# Patient Record
Sex: Female | Born: 1935 | Race: White | Hispanic: No | Marital: Single | Smoking: Never smoker
Health system: Southern US, Community
[De-identification: ages and names within clinical notes are randomized; demographics above are authoritative.]

## PROBLEM LIST (undated history)

## (undated) DIAGNOSIS — I10 Essential (primary) hypertension: Secondary | ICD-10-CM

## (undated) DIAGNOSIS — J449 Chronic obstructive pulmonary disease, unspecified: Secondary | ICD-10-CM

## (undated) DIAGNOSIS — M810 Age-related osteoporosis without current pathological fracture: Secondary | ICD-10-CM

## (undated) HISTORY — PX: ABDOMINAL HYSTERECTOMY: SHX81

---

## 2013-05-22 ENCOUNTER — Encounter (HOSPITAL_COMMUNITY): Payer: Self-pay

## 2013-05-22 ENCOUNTER — Inpatient Hospital Stay (HOSPITAL_COMMUNITY)
Admission: EM | Admit: 2013-05-22 | Discharge: 2013-05-23 | DRG: 392 | Disposition: A | Discharge status: Home / Self Care | Payer: Medicare Other | Attending: Internal Medicine | Admitting: Internal Medicine

## 2013-05-22 ENCOUNTER — Emergency Department (HOSPITAL_COMMUNITY): Payer: Medicare Other

## 2013-05-22 DIAGNOSIS — J441 Chronic obstructive pulmonary disease with (acute) exacerbation: Secondary | ICD-10-CM

## 2013-05-22 DIAGNOSIS — I10 Essential (primary) hypertension: Secondary | ICD-10-CM | POA: Diagnosis present

## 2013-05-22 DIAGNOSIS — D72829 Elevated white blood cell count, unspecified: Secondary | ICD-10-CM | POA: Diagnosis present

## 2013-05-22 DIAGNOSIS — M810 Age-related osteoporosis without current pathological fracture: Secondary | ICD-10-CM | POA: Diagnosis present

## 2013-05-22 DIAGNOSIS — K5289 Other specified noninfective gastroenteritis and colitis: Principal | ICD-10-CM | POA: Diagnosis present

## 2013-05-22 DIAGNOSIS — R109 Unspecified abdominal pain: Secondary | ICD-10-CM

## 2013-05-22 DIAGNOSIS — K5900 Constipation, unspecified: Secondary | ICD-10-CM

## 2013-05-22 HISTORY — DX: Age-related osteoporosis without current pathological fracture: M81.0

## 2013-05-22 HISTORY — DX: Chronic obstructive pulmonary disease, unspecified: J44.9

## 2013-05-22 HISTORY — DX: Essential (primary) hypertension: I10

## 2013-05-22 LAB — CBC WITH DIFFERENTIAL/PLATELET
Basophils Absolute: 0 10*3/uL (ref 0.0–0.1)
Lymphocytes Relative: 6 % — ABNORMAL LOW (ref 12–46)
Lymphs Abs: 1.2 10*3/uL (ref 0.7–4.0)
Neutrophils Relative %: 89 % — ABNORMAL HIGH (ref 43–77)
Platelets: 292 10*3/uL (ref 150–400)
RBC: 4.47 MIL/uL (ref 3.87–5.11)
WBC: 19.9 10*3/uL — ABNORMAL HIGH (ref 4.0–10.5)

## 2013-05-22 LAB — COMPREHENSIVE METABOLIC PANEL
ALT: 11 U/L (ref 0–35)
AST: 22 U/L (ref 0–37)
Alkaline Phosphatase: 71 U/L (ref 39–117)
CO2: 23 mEq/L (ref 19–32)
GFR calc Af Amer: >90 mL/min (ref >90)
GFR calc non Af Amer: 89 mL/min — ABNORMAL LOW (ref >90)
Glucose, Bld: 134 mg/dL — ABNORMAL HIGH (ref 70–99)
Potassium: 3.6 mEq/L (ref 3.5–5.1)
Sodium: 136 mEq/L (ref 135–145)
Total Protein: 7.9 g/dL (ref 6.0–8.3)

## 2013-05-22 LAB — URINALYSIS, ROUTINE W REFLEX MICROSCOPIC
Bilirubin Urine: NEGATIVE
Glucose, UA: NEGATIVE mg/dL
Specific Gravity, Urine: 1.018 (ref 1.005–1.030)
Urobilinogen, UA: 0.2 mg/dL (ref 0.0–1.0)

## 2013-05-22 LAB — URINE MICROSCOPIC-ADD ON

## 2013-05-22 LAB — LIPASE, BLOOD: Lipase: 29 U/L (ref 11–59)

## 2013-05-22 LAB — TROPONIN I: Troponin I: <0.3 ng/mL (ref <0.30)

## 2013-05-22 MED ORDER — CIPROFLOXACIN IN D5W 400 MG/200ML IV SOLN
400.0000 mg | Freq: Two times a day (BID) | INTRAVENOUS | Status: DC
  Administered 2013-05-22 – 2013-05-23 (×2): 400 mg via INTRAVENOUS
  Filled 2013-05-22 (×4): qty 200

## 2013-05-22 MED ORDER — ASPIRIN EC 81 MG PO TBEC
81.0000 mg | DELAYED_RELEASE_TABLET | ORAL | Status: DC
  Administered 2013-05-23: 81 mg via ORAL
  Filled 2013-05-22 (×2): qty 1

## 2013-05-22 MED ORDER — ONDANSETRON HCL 4 MG/2ML IJ SOLN: 4.0000 mg | Freq: Four times a day (QID) | INTRAMUSCULAR | Status: DC | PRN

## 2013-05-22 MED ORDER — METRONIDAZOLE IN NACL 5-0.79 MG/ML-% IV SOLN
500.0000 mg | Freq: Three times a day (TID) | INTRAVENOUS | Status: DC
  Administered 2013-05-22 – 2013-05-23 (×3): 500 mg via INTRAVENOUS
  Filled 2013-05-22 (×5): qty 100

## 2013-05-22 MED ORDER — HYDROMORPHONE HCL PF 1 MG/ML IJ SOLN
0.5000 mg | Freq: Once | INTRAMUSCULAR | Status: AC
  Administered 2013-05-22: 0.5 mg via INTRAVENOUS
  Filled 2013-05-22: qty 1

## 2013-05-22 MED ORDER — PANTOPRAZOLE SODIUM 40 MG IV SOLR
40.0000 mg | INTRAVENOUS | Status: DC
  Administered 2013-05-22: 40 mg via INTRAVENOUS
  Filled 2013-05-22 (×3): qty 40

## 2013-05-22 MED ORDER — ACETAMINOPHEN 325 MG PO TABS: 650.0000 mg | ORAL_TABLET | Freq: Four times a day (QID) | ORAL | Status: DC | PRN

## 2013-05-22 MED ORDER — ENOXAPARIN SODIUM 40 MG/0.4ML ~~LOC~~ SOLN
40.0000 mg | SUBCUTANEOUS | Status: DC
  Filled 2013-05-22: qty 0.4

## 2013-05-22 MED ORDER — ONDANSETRON HCL 4 MG/2ML IJ SOLN
4.0000 mg | Freq: Once | INTRAMUSCULAR | Status: AC
Start: 2013-05-22 — End: 2013-05-22
  Administered 2013-05-22: 4 mg via INTRAVENOUS
  Filled 2013-05-22: qty 2

## 2013-05-22 MED ORDER — SODIUM CHLORIDE 0.9 % IV SOLN
INTRAVENOUS | Status: DC
  Administered 2013-05-22: 18:00:00 via INTRAVENOUS

## 2013-05-22 MED ORDER — POLYETHYLENE GLYCOL 3350 17 G PO PACK
17.0000 g | PACK | Freq: Every day | ORAL | Status: DC
  Filled 2013-05-22 (×2): qty 1

## 2013-05-22 MED ORDER — ONDANSETRON HCL 4 MG PO TABS: 4.0000 mg | ORAL_TABLET | Freq: Four times a day (QID) | ORAL | Status: DC | PRN

## 2013-05-22 MED ORDER — SODIUM CHLORIDE 0.9 % IJ SOLN: 3.0000 mL | Freq: Two times a day (BID) | INTRAMUSCULAR | Status: DC

## 2013-05-22 MED ORDER — SODIUM CHLORIDE 0.9 % IV BOLUS (SEPSIS)
500.0000 mL | Freq: Once | INTRAVENOUS | Status: AC
  Administered 2013-05-22: 500 mL via INTRAVENOUS

## 2013-05-22 MED ORDER — MOMETASONE FURO-FORMOTEROL FUM 100-5 MCG/ACT IN AERO
2.0000 | INHALATION_SPRAY | Freq: Two times a day (BID) | RESPIRATORY_TRACT | Status: DC
  Administered 2013-05-22 – 2013-05-23 (×2): 2 via RESPIRATORY_TRACT
  Filled 2013-05-22: qty 8.8

## 2013-05-22 MED ORDER — ACETAMINOPHEN 650 MG RE SUPP: 650.0000 mg | Freq: Four times a day (QID) | RECTAL | Status: DC | PRN

## 2013-05-22 NOTE — ED Notes (Signed)
MD at bedside. 

## 2013-05-22 NOTE — ED Provider Notes (Signed)
History     CSN: [id number]  Arrival date & time 05/22/13  1237   First MD Initiated Contact with Patient 05/22/13 1315      Chief Complaint  Patient presents with  . Abdominal Pain    (Consider location/radiation/quality/duration/timing/severity/associated sxs/prior treatment) Patient is a 79 y.o. female presenting with abdominal pain. The history is provided by the patient (THE PT stastes she has abd pain and is constipated).  Abdominal Pain This is a new problem. The current episode started 12 to 24 hours ago. The problem occurs constantly. The problem has not changed since onset.Associated symptoms include abdominal pain. Pertinent negatives include no chest pain and no headaches. Nothing aggravates the symptoms. Nothing relieves the symptoms.    Past Medical History  Diagnosis Date  . Hypertension   . COPD (chronic obstructive pulmonary disease)   . Osteoporosis     Past Surgical History  Procedure Laterality Date  . Abdominal hysterectomy      No family history on file.  History  Substance Use Topics  . Smoking status: Never Smoker   . Smokeless tobacco: Not on file  . Alcohol Use: No    OB History   Grav Para Term Preterm Abortions TAB SAB Ect Mult Living                  Review of Systems  Constitutional: Negative for appetite change and fatigue.  HENT: Negative for congestion, sinus pressure and ear discharge.   Eyes: Negative for discharge.  Respiratory: Negative for cough.   Cardiovascular: Negative for chest pain.  Gastrointestinal: Positive for abdominal pain. Negative for diarrhea.  Genitourinary: Negative for frequency and hematuria.  Musculoskeletal: Negative for back pain.  Skin: Negative for rash.  Neurological: Negative for seizures and headaches.  Psychiatric/Behavioral: Negative for hallucinations.    Allergies  Penicillins and Sulfa antibiotics  Home Medications   Current Outpatient Rx  Name  Route  Sig  Dispense  Refill  .  amLODipine (NORVASC) 5 MG tablet   Oral   Take 5 mg by mouth daily.         Marland Kitchen aspirin EC 81 MG tablet   Oral   Take 81 mg by mouth every other day.         Marland Kitchen CALCIUM CARBONATE PO   Oral   Take 750 mg by mouth every other day.         . Coenzyme Q10 (CO Q 10) 10 MG CAPS   Oral   Take 1 capsule by mouth daily.         . Fluticasone-Salmeterol (ADVAIR) 250-50 MCG/DOSE AEPB   Inhalation   Inhale 1 puff into the lungs every 12 (twelve) hours.         Marland Kitchen ibuprofen (ADVIL,MOTRIN) 200 MG tablet   Oral   Take 400 mg by mouth every 6 (six) hours as needed for pain.         . methenamine (UREX) 1 G tablet   Oral   Take 1 g by mouth daily.         . Multiple Vitamin (MULTIVITAMIN WITH MINERALS) TABS   Oral   Take 1 tablet by mouth daily.         Marland Kitchen omeprazole (PRILOSEC) 20 MG capsule   Oral   Take 20 mg by mouth daily.         Marland Kitchen OVER THE COUNTER MEDICATION   Oral   Take 1 capsule by mouth 2 (two) times daily. Eye  promise restore         . simvastatin (ZOCOR) 10 MG tablet   Oral   Take 10 mg by mouth at bedtime.           BP 148/57  Pulse 61  Temp(Src) 97.7 F (36.5 C) (Oral)  Resp 16  Ht 5' (1.524 m)  Wt 120 lb (54.432 kg)  BMI 23.44 kg/m2  SpO2 100%  Physical Exam  Constitutional: She is oriented to person, place, and time. She appears well-developed.  HENT:  Head: Normocephalic.  Eyes: Conjunctivae and EOM are normal. No scleral icterus.  Neck: Neck supple. No thyromegaly present.  Cardiovascular: Normal rate and regular rhythm.  Exam reveals no gallop and no friction rub.   No murmur heard. Pulmonary/Chest: No stridor. She has no wheezes. She has no rales. She exhibits no tenderness.  Abdominal: She exhibits no distension. There is tenderness. There is no rebound.  Tender llq and rlq   Genitourinary:  Rectal hem neg  Musculoskeletal: Normal range of motion. She exhibits no edema.  Lymphadenopathy:    She has no cervical adenopathy.   Neurological: She is oriented to person, place, and time. Coordination normal.  Skin: No rash noted. No erythema.  Psychiatric: She has a normal mood and affect. Her behavior is normal.    ED Course  Procedures (including critical care time)  Labs Reviewed  CBC WITH DIFFERENTIAL - Abnormal; Notable for the following:    WBC 19.9 (*)    Neutrophils Relative % 89 (*)    Neutro Abs 17.7 (*)    Lymphocytes Relative 6 (*)    All other components within normal limits  COMPREHENSIVE METABOLIC PANEL - Abnormal; Notable for the following:    Glucose, Bld 134 (*)    GFR calc non Af Amer 89 (*)    All other components within normal limits  OCCULT BLOOD, POC DEVICE - Abnormal; Notable for the following:    Fecal Occult Bld POSITIVE (*)    All other components within normal limits  URINALYSIS, ROUTINE W REFLEX MICROSCOPIC   Ct Abdomen Pelvis Wo Contrast  05/22/2013   *RADIOLOGY REPORT*  Clinical Data: Abdominal pain.  CT ABDOMEN AND PELVIS WITHOUT CONTRAST  Technique:  Multidetector CT imaging of the abdomen and pelvis was performed following the standard protocol without intravenous contrast.  Comparison: None  Findings: The lung bases demonstrate patchy areas of scarring and atelectasis.  No pleural effusion.  There is a small hiatal hernia.  The unenhanced appearance of the liver is unremarkable.  A simple cyst is noted in the right lobe inferiorly.  The gallbladder is grossly normal.  No common bile duct dilatation.  The pancreas is normal.  The spleen is normal.  The adrenal glands and kidneys are normal.  No renal or obstructing ureteral calculi.  The stomach, duodenum, small bowel and colon are grossly normal without oral contrast.  There is a moderate amount of stool throughout the colon and down into the rectum which may suggest constipation.  The aorta demonstrates advanced atherosclerotic calcifications.  No focal aneurysm.  No mesenteric or retroperitoneal mass or adenopathy.  The uterus is  surgically absent.  The bladder appears normal.  No pelvic mass or adenopathy.  No significant free pelvic fluid collections.  No inguinal mass or hernia.  Examination of the bony structures demonstrates probable changes of Paget's disease involving the left hemi pelvis, left hip and the L5 vertebral body.  IMPRESSION:  1.  No acute abdominal/pelvic findings, mass  lesions or adenopathy. 2.  Moderate atherosclerotic calcifications involving the aorta. 3.  Osseous changes of Paget's disease involving the left hemi pelvis and left hip. 4.  Probable constipation. 5.  Small hiatal hernia.   Original Report Authenticated By: Rudie Meyer, M.D.     1. Abdominal pain       MDM          Benny Lennert, MD 05/22/13 1520

## 2013-05-22 NOTE — ED Notes (Signed)
She c/o sudden onset of severe low abd. Pain this morning.  She had had constipation, for which she had taken a suppository, then a Fleet's enema earlier this morning, and subsequently had a tiny, hard b.m.  She feels now as if she is "about to pass out".  Skin pale, warm and dry and she is breathing normally.

## 2013-05-22 NOTE — H&P (Signed)
[facility] History and Physical  Brittney Perez VHQ:[id number] DOB: 07/24/1931 DOA: 05/22/2013  Referring physician: ER PCP: No primary provider on file.   Chief Complaint: Abdominal pain   HPI:  79 year old female presents to the ER with a chief complaint of abdominal pain which is described in the left and right lower quadrant. The patient has had a bowel movement this morning however he still feels full. She denies any nausea or vomiting. She denies any fever chills riders cough shortness of breath. She denies any hematochezia or melena. She denies any weight loss.      Review of Systems: negative for the following  Constitutional: Denies fever, chills, diaphoresis, appetite change and fatigue.  HEENT: Denies photophobia, eye pain, redness, hearing loss, ear pain, congestion, sore throat, rhinorrhea, sneezing, mouth sores, trouble swallowing, neck pain, neck stiffness and tinnitus.  Respiratory: Denies SOB, DOE, cough, chest tightness, and wheezing.  Cardiovascular: Denies chest pain, palpitations and leg swelling.  Gastrointestinal: Denies nausea, vomiting, abdominal pain, diarrhea, constipation, blood in stool and abdominal distention.  Genitourinary: Denies dysuria, urgency, frequency, hematuria, flank pain and difficulty urinating.  Musculoskeletal: Denies myalgias, back pain, joint swelling, arthralgias and gait problem.  Skin: Denies pallor, rash and wound.  Neurological: Denies dizziness, seizures, syncope, weakness, light-headedness, numbness and headaches.  Hematological: Denies adenopathy. Easy bruising, personal or family bleeding history  Psychiatric/Behavioral: Denies suicidal ideation, mood changes, confusion, nervousness, sleep disturbance and agitation       Past Medical History  Diagnosis Date  . Hypertension   . COPD (chronic obstructive pulmonary disease)   . Osteoporosis      Past Surgical History  Procedure Laterality Date  . Abdominal  hysterectomy        Social History:  reports that she has never smoked. She does not have any smokeless tobacco history on file. She reports that she does not drink alcohol or use illicit drugs.     Allergies  Allergen Reactions  . Penicillins Rash  . Sulfa Antibiotics Hives and Rash    No family history on file.   Prior to Admission medications   Medication Sig Start Date End Date Taking? Authorizing Provider  amLODipine (NORVASC) 5 MG tablet Take 5 mg by mouth daily.   Yes Historical Provider, MD  aspirin EC 81 MG tablet Take 81 mg by mouth every other day.   Yes Historical Provider, MD  CALCIUM CARBONATE PO Take 750 mg by mouth every other day.   Yes Historical Provider, MD  Coenzyme Q10 (CO Q 10) 10 MG CAPS Take 1 capsule by mouth daily.   Yes Historical Provider, MD  Fluticasone-Salmeterol (ADVAIR) 250-50 MCG/DOSE AEPB Inhale 1 puff into the lungs every 12 (twelve) hours.   Yes Historical Provider, MD  ibuprofen (ADVIL,MOTRIN) 200 MG tablet Take 400 mg by mouth every 6 (six) hours as needed for pain.   Yes Historical Provider, MD  methenamine (UREX) 1 G tablet Take 1 g by mouth daily.   Yes Historical Provider, MD  Multiple Vitamin (MULTIVITAMIN WITH MINERALS) TABS Take 1 tablet by mouth daily.   Yes Historical Provider, MD  omeprazole (PRILOSEC) 20 MG capsule Take 20 mg by mouth daily.   Yes Historical Provider, MD  OVER THE COUNTER MEDICATION Take 1 capsule by mouth 2 (two) times daily. Eye promise restore   Yes Historical Provider, MD  simvastatin (ZOCOR) 10 MG tablet Take 10 mg by mouth at bedtime.   Yes Historical Provider, MD     Physical Exam: Filed Vitals:  05/22/13 1247 05/22/13 1500 05/22/13 1623  BP: 148/57  134/74  Pulse: 61  71  Temp: 97.7 F (36.5 C)  98.1 F (36.7 C)  TempSrc: Oral  Oral  Resp: 16 16 18   Height: 5' (1.524 m)    Weight: 54.432 kg (120 lb)    SpO2: 100%  94%     Constitutional: Vital signs reviewed. Patient is a well-developed and  well-nourished in no acute distress and cooperative with exam. Alert and oriented x3.  Head: Normocephalic and atraumatic  Ear: TM normal bilaterally  Mouth: no erythema or exudates, MMM  Eyes: PERRL, EOMI, conjunctivae normal, No scleral icterus.  Neck: Supple, Trachea midline normal ROM, No JVD, mass, thyromegaly, or carotid bruit present.  Cardiovascular: RRR, S1 normal, S2 normal, no MRG, pulses symmetric and intact bilaterally  Pulmonary/Chest: CTAB, no wheezes, rales, or rhonchi  Abdominal: Soft. Non-tender, non-distended, bowel sounds are normal, no masses, organomegaly, or guarding present.  GU: no CVA tenderness Musculoskeletal: No joint deformities, erythema, or stiffness, ROM full and no nontender Ext: no edema and no cyanosis, pulses palpable bilaterally (DP and PT)  Hematology: no cervical, inginal, or axillary adenopathy.  Neurological: A&O x3, Strenght is normal and symmetric bilaterally, cranial nerve II-XII are grossly intact, no focal motor deficit, sensory intact to light touch bilaterally.  Skin: Warm, dry and intact. No rash, cyanosis, or clubbing.  Psychiatric: Normal mood and affect. speech and behavior is normal. Judgment and thought content normal. Cognition and memory are normal.       Labs on Admission:    Basic Metabolic Panel:  Recent Labs Lab 05/22/13 1340  NA 136  K 3.6  CL 100  CO2 23  GLUCOSE 134*  BUN 22  CREATININE 0.50  CALCIUM 10.0   Liver Function Tests:  Recent Labs Lab 05/22/13 1340  AST 22  ALT 11  ALKPHOS 71  BILITOT 0.4  PROT 7.9  ALBUMIN 4.5    Recent Labs Lab 05/22/13 1558  LIPASE 29   No results found for this basename: AMMONIA,  in the last 168 hours CBC:  Recent Labs Lab 05/22/13 1340  WBC 19.9*  NEUTROABS 17.7*  HGB 14.1  HCT 42.3  MCV 94.6  PLT 292   Cardiac Enzymes:  Recent Labs Lab 05/22/13 1558  TROPONINI <0.30    BNP (last 3 results) No results found for this basename: PROBNP,  in the  last 8760 hours    CBG: No results found for this basename: GLUCAP,  in the last 168 hours  Radiological Exams on Admission: Ct Abdomen Pelvis Wo Contrast  05/22/2013   *RADIOLOGY REPORT*  Clinical Data: Abdominal pain.  CT ABDOMEN AND PELVIS WITHOUT CONTRAST  Technique:  Multidetector CT imaging of the abdomen and pelvis was performed following the standard protocol without intravenous contrast.  Comparison: None  Findings: The lung bases demonstrate patchy areas of scarring and atelectasis.  No pleural effusion.  There is a small hiatal hernia.  The unenhanced appearance of the liver is unremarkable.  A simple cyst is noted in the right lobe inferiorly.  The gallbladder is grossly normal.  No common bile duct dilatation.  The pancreas is normal.  The spleen is normal.  The adrenal glands and kidneys are normal.  No renal or obstructing ureteral calculi.  The stomach, duodenum, small bowel and colon are grossly normal without oral contrast.  There is a moderate amount of stool throughout the colon and down into the rectum which may suggest constipation.  The  aorta demonstrates advanced atherosclerotic calcifications.  No focal aneurysm.  No mesenteric or retroperitoneal mass or adenopathy.  The uterus is surgically absent.  The bladder appears normal.  No pelvic mass or adenopathy.  No significant free pelvic fluid collections.  No inguinal mass or hernia.  Examination of the bony structures demonstrates probable changes of Paget's disease involving the left hemi pelvis, left hip and the L5 vertebral body.  IMPRESSION:  1.  No acute abdominal/pelvic findings, mass lesions or adenopathy. 2.  Moderate atherosclerotic calcifications involving the aorta. 3.  Osseous changes of Paget's disease involving the left hemi pelvis and left hip. 4.  Probable constipation. 5.  Small hiatal hernia.   Original Report Authenticated By: Rudie Meyer, M.D.    EKG: Independently reviewed.   Assessment/Plan Principal  Problem:   Unspecified constipation Active Problems:   COPD   Abdominal pain Most likely was related to constipation Patient had 2 BMs after coming to the ED No reported diarrhea despite elevated white count Will empirically start the patient on Cipro and Flagyl Check for C. difficile and the patient continues to have more bowel movements. CT scan did not show any acute abdominal findings   COPD stable, continue Advair  Hypertension hold Norvasc     Code Status:   full Family Communication: bedside Disposition Plan: admit   Time spent: 70 mins   [facility] Pager [phone]  If 7PM-7AM, please contact night-coverage [email or web address] Password [facility] 05/22/2013, 5:44 PM

## 2013-05-23 LAB — CBC
HCT: 37.1 % (ref 36.0–46.0)
Hemoglobin: 12.1 g/dL (ref 12.0–15.0)
MCH: 31.2 pg (ref 26.0–34.0)
MCHC: 32.6 g/dL (ref 30.0–36.0)
MCV: 95.6 fL (ref 78.0–100.0)
RDW: 12.9 % (ref 11.5–15.5)

## 2013-05-23 LAB — CLOSTRIDIUM DIFFICILE BY PCR: Toxigenic C. Difficile by PCR: NEGATIVE

## 2013-05-23 NOTE — Discharge Summary (Signed)
Physician Discharge Summary  Brittney Perez MRN: [id number] DOB/AGE: [birth year] 79 y.o.  PCP: No primary provider on file.   Admit date: 05/22/2013 Discharge date: 05/23/2013  Discharge Diagnoses:  Probable gastroenteritis   Unspecified constipation Active Problems:   COPD exacerbation     Medication List    STOP taking these medications       ibuprofen 200 MG tablet  Commonly known as:  ADVIL,MOTRIN      TAKE these medications       amLODipine 5 MG tablet  Commonly known as:  NORVASC  Take 5 mg by mouth daily.     aspirin EC 81 MG tablet  Take 81 mg by mouth every other day.     CALCIUM CARBONATE PO  Take 750 mg by mouth every other day.     Co Q 10 10 MG Caps  Take 1 capsule by mouth daily.     Fluticasone-Salmeterol 250-50 MCG/DOSE Aepb  Commonly known as:  ADVAIR  Inhale 1 puff into the lungs every 12 (twelve) hours.     multivitamin with minerals Tabs  Take 1 tablet by mouth daily.     omeprazole 20 MG capsule  Commonly known as:  PRILOSEC  Take 20 mg by mouth daily.     OVER THE COUNTER MEDICATION  Take 1 capsule by mouth 2 (two) times daily. Eye promise restore     simvastatin 10 MG tablet  Commonly known as:  ZOCOR  Take 10 mg by mouth at bedtime.     UREX 1 G tablet  Generic drug:  methenamine  Take 1 g by mouth daily.        Discharge Condition: Stable  Disposition: Final discharge disposition not confirmed   Consults: * None   Significant Diagnostic Studies: Ct Abdomen Pelvis Wo Contrast  05/22/2013   *RADIOLOGY REPORT*  Clinical Data: Abdominal pain.  CT ABDOMEN AND PELVIS WITHOUT CONTRAST  Technique:  Multidetector CT imaging of the abdomen and pelvis was performed following the standard protocol without intravenous contrast.  Comparison: None  Findings: The lung bases demonstrate patchy areas of scarring and atelectasis.  No pleural effusion.  There is a small hiatal hernia.  The unenhanced appearance of the liver is  unremarkable.  A simple cyst is noted in the right lobe inferiorly.  The gallbladder is grossly normal.  No common bile duct dilatation.  The pancreas is normal.  The spleen is normal.  The adrenal glands and kidneys are normal.  No renal or obstructing ureteral calculi.  The stomach, duodenum, small bowel and colon are grossly normal without oral contrast.  There is a moderate amount of stool throughout the colon and down into the rectum which may suggest constipation.  The aorta demonstrates advanced atherosclerotic calcifications.  No focal aneurysm.  No mesenteric or retroperitoneal mass or adenopathy.  The uterus is surgically absent.  The bladder appears normal.  No pelvic mass or adenopathy.  No significant free pelvic fluid collections.  No inguinal mass or hernia.  Examination of the bony structures demonstrates probable changes of Paget's disease involving the left hemi pelvis, left hip and the L5 vertebral body.  IMPRESSION:  1.  No acute abdominal/pelvic findings, mass lesions or adenopathy. 2.  Moderate atherosclerotic calcifications involving the aorta. 3.  Osseous changes of Paget's disease involving the left hemi pelvis and left hip. 4.  Probable constipation. 5.  Small hiatal hernia.   Original Report Authenticated By: Rudie Meyer, M.D.  Microbiology: No results found for this or any previous visit (from the past 240 hour(s)).   Labs: Results for orders placed during the hospital encounter of 05/22/13 (from the past 48 hour(s))  CBC WITH DIFFERENTIAL     Status: Abnormal   Collection Time    05/22/13  1:40 PM      Result Value Range   WBC 19.9 (*) 4.0 - 10.5 K/uL   RBC 4.47  3.87 - 5.11 MIL/uL   Hemoglobin 14.1  12.0 - 15.0 g/dL   HCT 16.1  09.6 - 04.5 %   MCV 94.6  78.0 - 100.0 fL   MCH 31.5  26.0 - 34.0 pg   MCHC 33.3  30.0 - 36.0 g/dL   RDW 40.9  81.1 - 91.4 %   Platelets 292  150 - 400 K/uL   Neutrophils Relative % 89 (*) 43 - 77 %   Neutro Abs 17.7 (*) 1.7 - 7.7  K/uL   Lymphocytes Relative 6 (*) 12 - 46 %   Lymphs Abs 1.2  0.7 - 4.0 K/uL   Monocytes Relative 5  3 - 12 %   Monocytes Absolute 0.9  0.1 - 1.0 K/uL   Eosinophils Relative 0  0 - 5 %   Eosinophils Absolute 0.0  0.0 - 0.7 K/uL   Basophils Relative 0  0 - 1 %   Basophils Absolute 0.0  0.0 - 0.1 K/uL  COMPREHENSIVE METABOLIC PANEL     Status: Abnormal   Collection Time    05/22/13  1:40 PM      Result Value Range   Sodium 136  135 - 145 mEq/L   Potassium 3.6  3.5 - 5.1 mEq/L   Chloride 100  96 - 112 mEq/L   CO2 23  19 - 32 mEq/L   Glucose, Bld 134 (*) 70 - 99 mg/dL   BUN 22  6 - 23 mg/dL   Creatinine, Ser 7.82  0.50 - 1.10 mg/dL   Calcium 95.6  8.4 - 21.3 mg/dL   Total Protein 7.9  6.0 - 8.3 g/dL   Albumin 4.5  3.5 - 5.2 g/dL   AST 22  0 - 37 U/L   ALT 11  0 - 35 U/L   Alkaline Phosphatase 71  39 - 117 U/L   Total Bilirubin 0.4  0.3 - 1.2 mg/dL   GFR calc non Af Amer 89 (*) >90 mL/min   GFR calc Af Amer >90  >90 mL/min   Comment:            The eGFR has been calculated     using the CKD EPI equation.     This calculation has not been     validated in all clinical     situations.     eGFR's persistently     <90 mL/min signify     possible Chronic Kidney Disease.  OCCULT BLOOD, POC DEVICE     Status: Abnormal   Collection Time    05/22/13  2:58 PM      Result Value Range   Fecal Occult Bld POSITIVE (*) NEGATIVE  URINALYSIS, ROUTINE W REFLEX MICROSCOPIC     Status: Abnormal   Collection Time    05/22/13  3:06 PM      Result Value Range   Color, Urine YELLOW  YELLOW   APPearance CLOUDY (*) CLEAR   Specific Gravity, Urine 1.018  1.005 - 1.030   pH 6.5  5.0 - 8.0   Glucose,  UA NEGATIVE  NEGATIVE mg/dL   Hgb urine dipstick TRACE (*) NEGATIVE   Bilirubin Urine NEGATIVE  NEGATIVE   Ketones, ur 40 (*) NEGATIVE mg/dL   Protein, ur NEGATIVE  NEGATIVE mg/dL   Urobilinogen, UA 0.2  0.0 - 1.0 mg/dL   Nitrite NEGATIVE  NEGATIVE   Leukocytes, UA MODERATE (*) NEGATIVE  URINE  MICROSCOPIC-ADD ON     Status: Abnormal   Collection Time    05/22/13  3:06 PM      Result Value Range   Squamous Epithelial / LPF RARE  RARE   WBC, UA 3-6  <3 WBC/hpf   RBC / HPF 0-2  <3 RBC/hpf   Bacteria, UA FEW (*) RARE  TROPONIN I     Status: None   Collection Time    05/22/13  3:58 PM      Result Value Range   Troponin I <0.30  <0.30 ng/mL   Comment:            Due to the release kinetics of cTnI,     a negative result within the first hours     of the onset of symptoms does not rule out     myocardial infarction with certainty.     If myocardial infarction is still suspected,     repeat the test at appropriate intervals.  LIPASE, BLOOD     Status: None   Collection Time    05/22/13  3:58 PM      Result Value Range   Lipase 29  11 - 59 U/L  TROPONIN I     Status: None   Collection Time    05/22/13  9:40 PM      Result Value Range   Troponin I <0.30  <0.30 ng/mL   Comment:            Due to the release kinetics of cTnI,     a negative result within the first hours     of the onset of symptoms does not rule out     myocardial infarction with certainty.     If myocardial infarction is still suspected,     repeat the test at appropriate intervals.  TROPONIN I     Status: None   Collection Time    05/23/13  5:40 AM      Result Value Range   Troponin I <0.30  <0.30 ng/mL   Comment:            Due to the release kinetics of cTnI,     a negative result within the first hours     of the onset of symptoms does not rule out     myocardial infarction with certainty.     If myocardial infarction is still suspected,     repeat the test at appropriate intervals.     HPI  79 year old female presents to the ER with a chief complaint of abdominal pain which is described in the left and right lower quadrant. The patient has had a bowel movement this morning however he still feels full. She denies any nausea or vomiting. She denies any fever chills riders cough shortness of  breath. She denies any hematochezia or melena. She denies any weight loss.   HOSPITAL COURSE: Abdominal pain Most likely was related to constipation /gastroenteritis in the setting of elevated white count Patient had 2 BMs after coming to the ED  No reported diarrhea despite elevated white count  She was empirically treated with  Cipro and Flagyl  Pending for C. difficile but no diarrhea  CT scan did not show any acute abdominal findings Leukocytosis is improving Discharge home with aggressive constipation protocol and hydration Routine outpatient colonoscopy     Discharge Exam:  Blood pressure 119/52, pulse 64, temperature 98.3 F (36.8 C), temperature source Oral, resp. rate 18, height 5' (1.524 m), weight 54.432 kg (120 lb), SpO2 97.00%.   Constitutional: Vital signs reviewed. Patient is a well-developed and well-nourished in no acute distress and cooperative with exam. Alert and oriented x3.  Head: Normocephalic and atraumatic  Ear: TM normal bilaterally  Mouth: no erythema or exudates, MMM  Eyes: PERRL, EOMI, conjunctivae normal, No scleral icterus.  Neck: Supple, Trachea midline normal ROM, No JVD, mass, thyromegaly, or carotid bruit present.  Cardiovascular: RRR, S1 normal, S2 normal, no MRG, pulses symmetric and intact bilaterally  Pulmonary/Chest: CTAB, no wheezes, rales, or rhonchi  Abdominal: Soft. Non-tender, non-distended, bowel sounds are normal, no masses, organomegaly, or guarding present.  GU: no CVA tenderness Musculoskeletal: No joint deformities, erythema, or stiffness, ROM full and no nontender Ext: no edema and no cyanosis, pulses palpable bilaterally (DP and PT)  Hematology: no cervical, inginal, or axillary adenopathy.  Neurological: A&O x3, Strenght is normal and symmetric bilaterally, cranial nerve II-XII are grossly intact, no focal motor deficit, sensory intact to light touch bilaterally.  Skin: Warm, dry and intact. No rash, cyanosis, or clubbing.   Psychiatric: Normal mood and affect. speech and behavior is normal. Judgment and thought content normal. Cognition and memory are normal.          Signed: Makaylie Dedeaux 05/23/2013, 7:38 AM

## 2013-05-25 LAB — URINE CULTURE: Colony Count: >=100000

## 2014-10-01 IMAGING — CT CT ABD-PELV W/O CM
1 of 2 series · 15 of 32 positions shown, 19 images · non-contrast
Comparison: None

CLINICAL DATA: Abdominal pain.

CT ABDOMEN AND PELVIS WITHOUT CONTRAST
TECHNIQUE: Multidetector CT imaging of the abdomen and pelvis was
performed following the standard protocol without intravenous
contrast.

[Series 2: abd/pel w/o · axial · non-contrast · 0.67mm/px · z∈[-428,-83]mm · 15 of 76 slices shown, 19 images]
[im 4/76  soft-tissue]
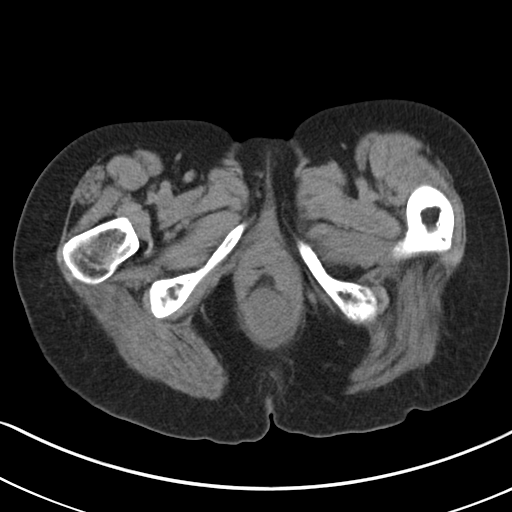
[im 4/76  bone]
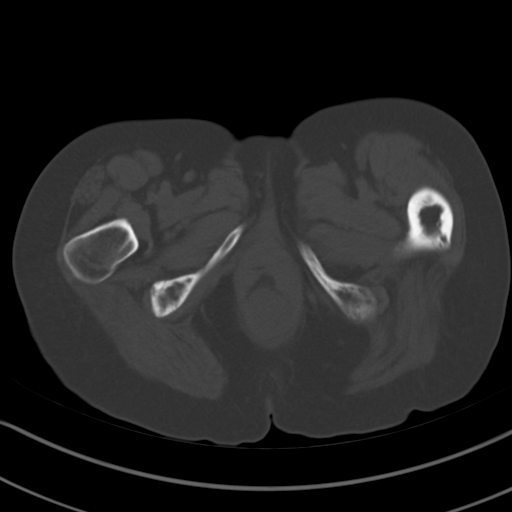
[im 10/76  soft-tissue]
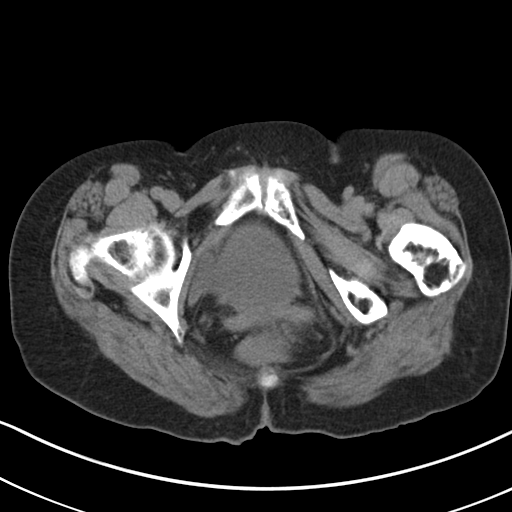
[im 16/76  soft-tissue]
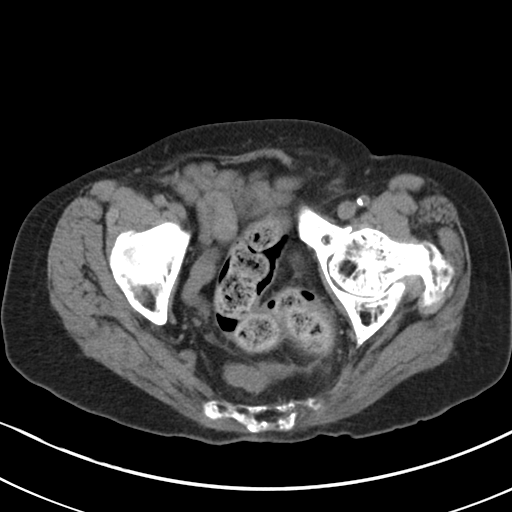
[im 22/76  soft-tissue]
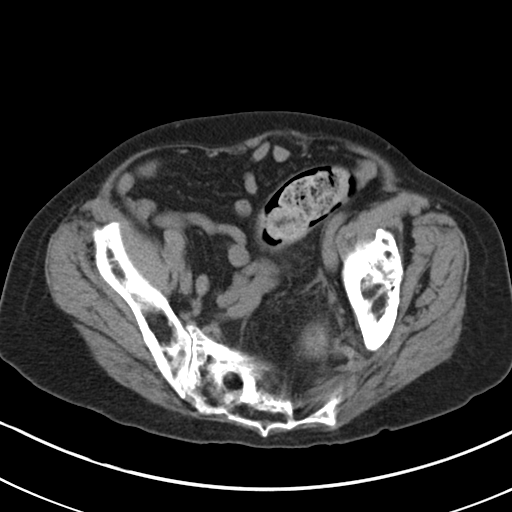
[im 28/76  soft-tissue]
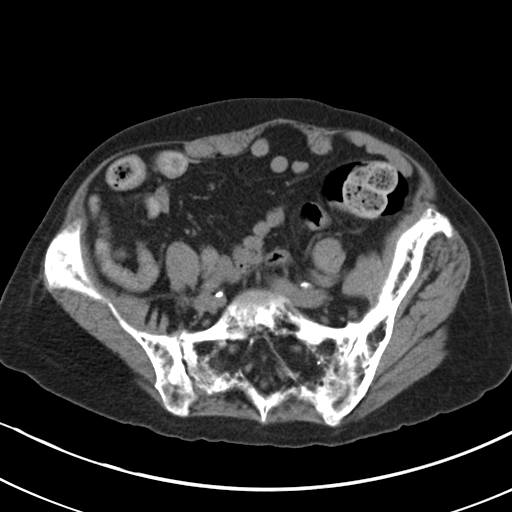
[im 34/76  soft-tissue]
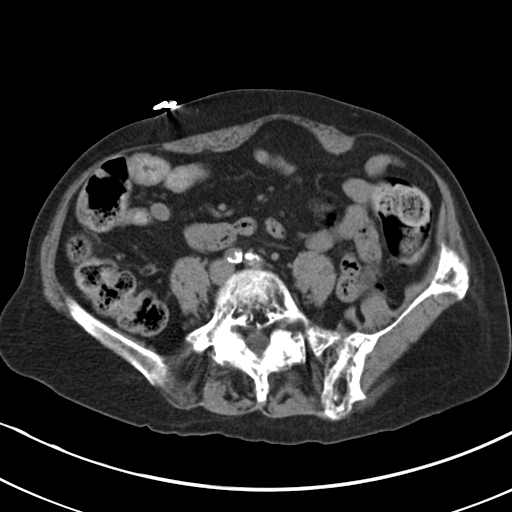
[im 40/76  soft-tissue]
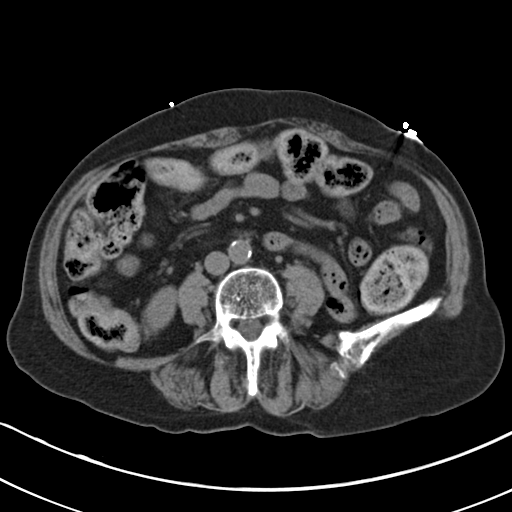
[im 43/76  soft-tissue]
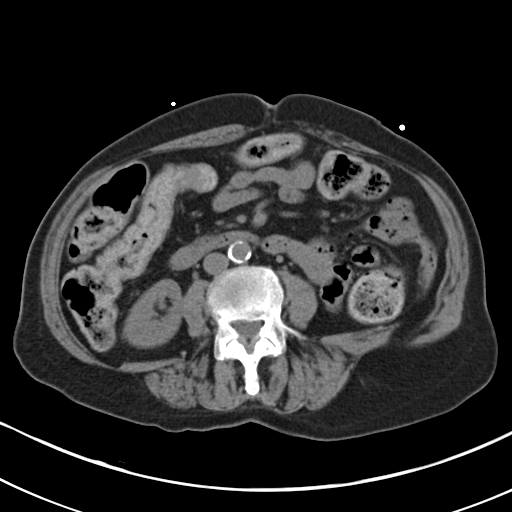
[im 49/76  soft-tissue]
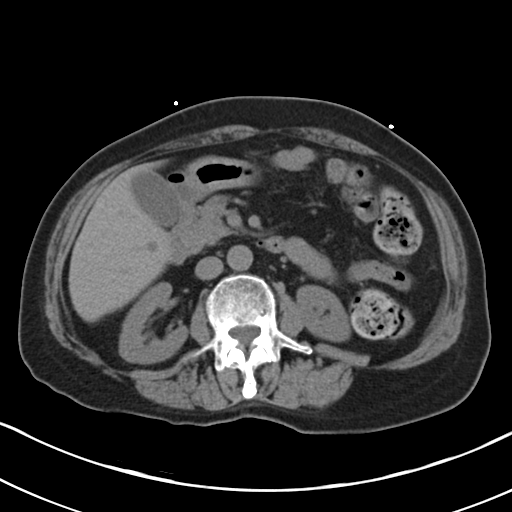
[im 49/76  bone]
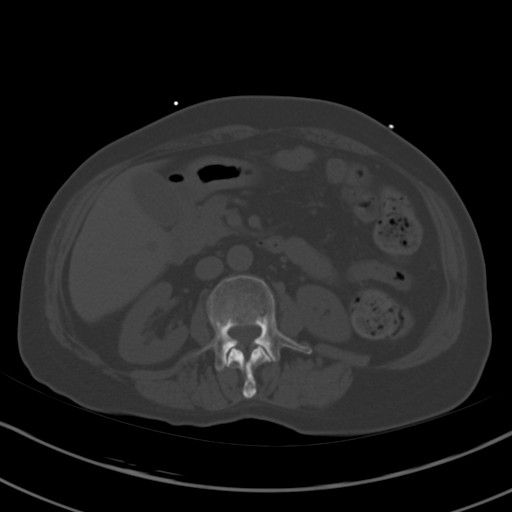
[im 55/76  soft-tissue]
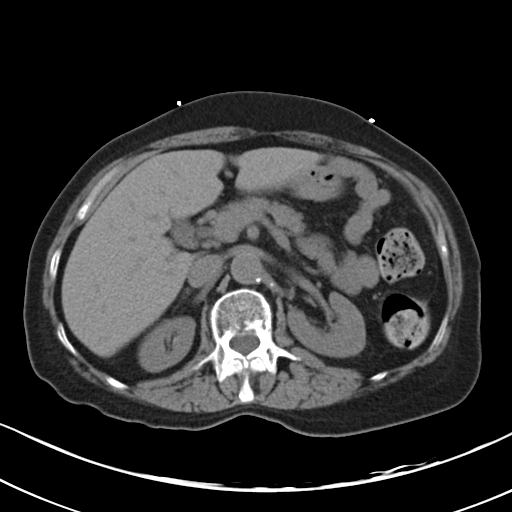
[im 61/76  soft-tissue]
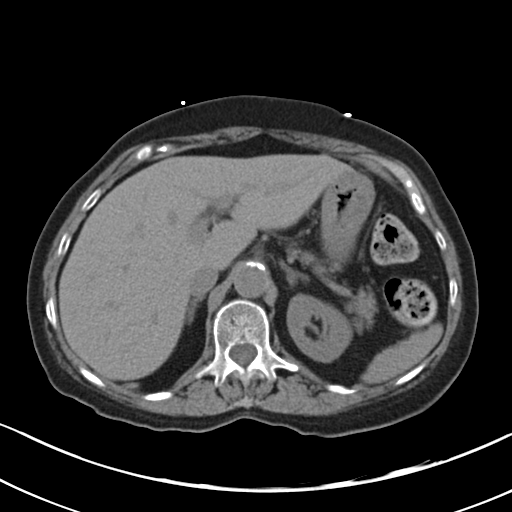
[im 64/76  lung]
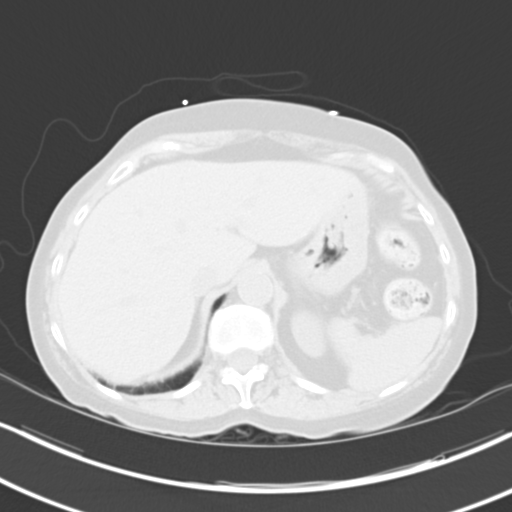
[im 67/76  soft-tissue]
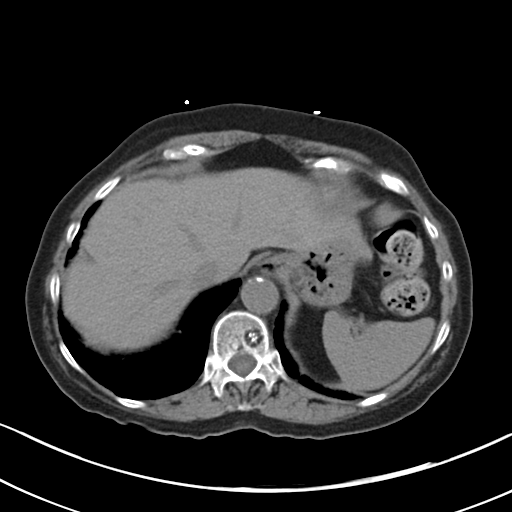
[im 67/76  lung]
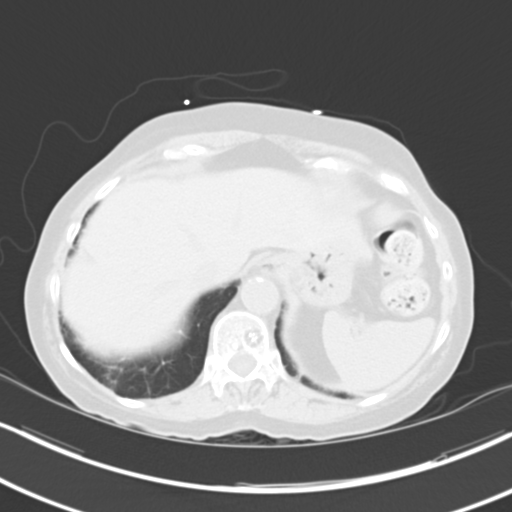
[im 70/76  lung]
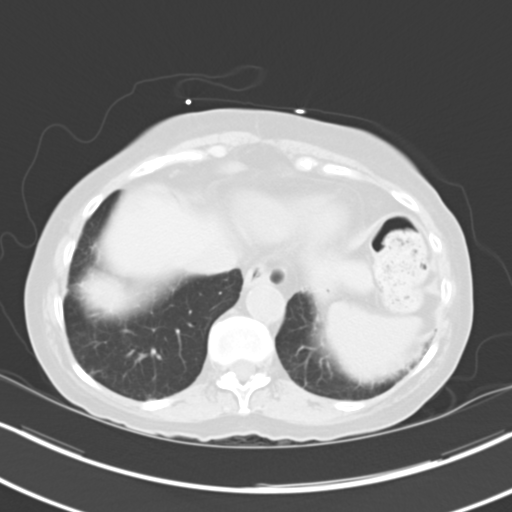
[im 73/76  soft-tissue]
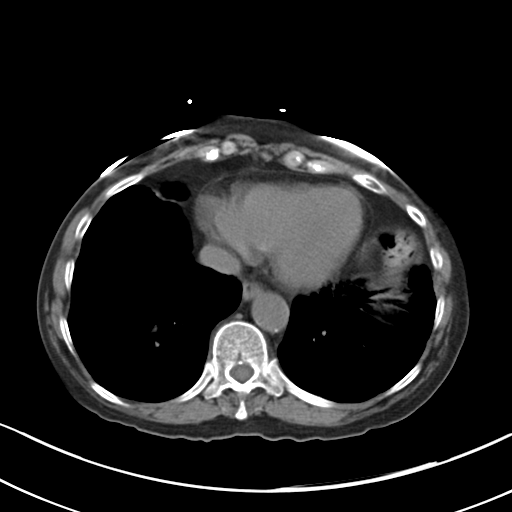
[im 73/76  lung]
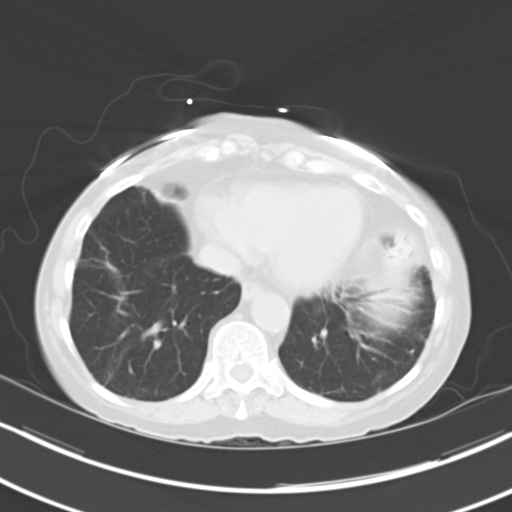

[15 of 32 positions shown; findings below may reference images not displayed]

FINDINGS: The lung bases demonstrate patchy areas of scarring and
atelectasis.  No pleural effusion.  There is a small hiatal hernia.

The unenhanced appearance of the liver is unremarkable.  A simple
cyst is noted in the right lobe inferiorly.  The gallbladder is
grossly normal.  No common bile duct dilatation.  The pancreas is
normal.  The spleen is normal.  The adrenal glands and kidneys are
normal.  No renal or obstructing ureteral calculi.

The stomach, duodenum, small bowel and colon are grossly normal
without oral contrast.  There is a moderate amount of stool
throughout the colon and down into the rectum which may suggest
constipation.

The aorta demonstrates advanced atherosclerotic calcifications.  No
focal aneurysm.  No mesenteric or retroperitoneal mass or
adenopathy.

The uterus is surgically absent.  The bladder appears normal.  No
pelvic mass or adenopathy.  No significant free pelvic fluid
collections.  No inguinal mass or hernia.

Examination of the bony structures demonstrates probable changes of
Paget's disease involving the left hemi pelvis, left hip and the L5
vertebral body..
IMPRESSION: 1.  No acute abdominal/pelvic findings, mass lesions or adenopathy.
2.  Moderate atherosclerotic calcifications involving the aorta.
3.  Osseous changes of Paget's disease involving the left hemi
pelvis and left hip.
4.  Probable constipation.
5.  Small hiatal hernia.

## 2015-04-02 ENCOUNTER — Emergency Department (HOSPITAL_COMMUNITY): Payer: Medicare Other

## 2015-04-02 ENCOUNTER — Emergency Department (HOSPITAL_COMMUNITY)
Admission: EM | Admit: 2015-04-02 | Discharge: 2015-04-02 | Disposition: A | Discharge status: Home / Self Care | Payer: Medicare Other | Attending: Emergency Medicine | Admitting: Emergency Medicine

## 2015-04-02 ENCOUNTER — Encounter (HOSPITAL_COMMUNITY): Payer: Self-pay

## 2015-04-02 DIAGNOSIS — M25532 Pain in left wrist: Secondary | ICD-10-CM

## 2015-04-02 DIAGNOSIS — Y9289 Other specified places as the place of occurrence of the external cause: Secondary | ICD-10-CM | POA: Insufficient documentation

## 2015-04-02 DIAGNOSIS — Z79899 Other long term (current) drug therapy: Secondary | ICD-10-CM | POA: Insufficient documentation

## 2015-04-02 DIAGNOSIS — Z7982 Long term (current) use of aspirin: Secondary | ICD-10-CM | POA: Diagnosis not present

## 2015-04-02 DIAGNOSIS — I10 Essential (primary) hypertension: Secondary | ICD-10-CM | POA: Diagnosis not present

## 2015-04-02 DIAGNOSIS — Z880 Allergy status to penicillin: Secondary | ICD-10-CM | POA: Diagnosis not present

## 2015-04-02 DIAGNOSIS — W1830XA Fall on same level, unspecified, initial encounter: Secondary | ICD-10-CM | POA: Insufficient documentation

## 2015-04-02 DIAGNOSIS — M810 Age-related osteoporosis without current pathological fracture: Secondary | ICD-10-CM | POA: Insufficient documentation

## 2015-04-02 DIAGNOSIS — J449 Chronic obstructive pulmonary disease, unspecified: Secondary | ICD-10-CM | POA: Diagnosis not present

## 2015-04-02 DIAGNOSIS — Y9389 Activity, other specified: Secondary | ICD-10-CM | POA: Insufficient documentation

## 2015-04-02 DIAGNOSIS — Y998 Other external cause status: Secondary | ICD-10-CM | POA: Insufficient documentation

## 2015-04-02 DIAGNOSIS — S6992XA Unspecified injury of left wrist, hand and finger(s), initial encounter: Secondary | ICD-10-CM | POA: Insufficient documentation

## 2015-04-02 DIAGNOSIS — W19XXXA Unspecified fall, initial encounter: Secondary | ICD-10-CM

## 2015-04-02 MED ORDER — HYDROCODONE-ACETAMINOPHEN 5-325 MG PO TABS
1.0000 | ORAL_TABLET | Freq: Once | ORAL | Status: AC
  Administered 2015-04-02: 1 via ORAL
  Filled 2015-04-02: qty 1

## 2015-04-02 NOTE — Discharge Instructions (Signed)
If you were given medicines take as directed.  If you are on coumadin or contraceptives realize their levels and effectiveness is altered by many different medicines.  If you have any reaction (rash, tongues swelling, other) to the medicines stop taking and see a physician.   Please follow up as directed and return to the ER or see a physician for new or worsening symptoms.  Thank you. Filed Vitals:   04/02/15 1656  BP: 185/64  Pulse: 82  Temp: 98.4 F (36.9 C)  TempSrc: Oral  Resp: 18  SpO2: 96%

## 2015-04-02 NOTE — ED Notes (Signed)
Signature pad not working- DC instructions given to patient

## 2015-04-02 NOTE — ED Notes (Addendum)
Pt presents with c/o fall and left hand injury. Pt reports she fell around 10 am onto concrete and believes she may have braced herself with her left hand. Pt reports the pain started in her hand but has since moved to her wrist as well. Pt also has a small abrasion to her right elbow, covered with a band aid.

## 2015-04-03 NOTE — ED Provider Notes (Signed)
CSN: [id number]     Arrival date & time 04/02/15  1637 History   First MD Initiated Contact with Patient 04/02/15 1658     Chief Complaint  Patient presents with  . Fall  . Hand Injury     (Consider location/radiation/quality/duration/timing/severity/associated sxs/prior Treatment) HPI Comments: 81 yo presents with left hand/ wrist injury since this am, pain gradually worsening, worse with movement, pt recall events, no loc or head injury, no sxs prior to fall.    Patient is a [age] y.o. female presenting with fall and hand injury. The history is provided by the patient.  Fall Pertinent negatives include no chest pain, no abdominal pain, no headaches and no shortness of breath.  Hand Injury Associated symptoms: no back pain, no fever and no neck pain     Past Medical History  Diagnosis Date  . Hypertension   . COPD (chronic obstructive pulmonary disease)   . Osteoporosis    Past Surgical History  Procedure Laterality Date  . Abdominal hysterectomy     No family history on file. History  Substance Use Topics  . Smoking status: Never Smoker   . Smokeless tobacco: Not on file  . Alcohol Use: No   OB History    No data available     Review of Systems  Constitutional: Negative for fever.  Eyes: Negative for visual disturbance.  Respiratory: Negative for shortness of breath.   Cardiovascular: Negative for chest pain.  Gastrointestinal: Negative for vomiting and abdominal pain.  Musculoskeletal: Positive for joint swelling. Negative for back pain, neck pain and neck stiffness.  Skin: Negative for rash.  Neurological: Negative for light-headedness and headaches.      Allergies  Penicillins and Sulfa antibiotics  Home Medications   Prior to Admission medications   Medication Sig Start Date End Date Taking? Authorizing Provider  amLODipine (NORVASC) 5 MG tablet Take 5 mg by mouth daily.    Historical Provider, MD  aspirin EC 81 MG tablet Take 81 mg by mouth every other  day.    Historical Provider, MD  CALCIUM CARBONATE PO Take 750 mg by mouth every other day.    Historical Provider, MD  Coenzyme Q10 (CO Q 10) 10 MG CAPS Take 1 capsule by mouth daily.    Historical Provider, MD  Fluticasone-Salmeterol (ADVAIR) 250-50 MCG/DOSE AEPB Inhale 1 puff into the lungs every 12 (twelve) hours.    Historical Provider, MD  methenamine (UREX) 1 G tablet Take 1 g by mouth daily.    Historical Provider, MD  Multiple Vitamin (MULTIVITAMIN WITH MINERALS) TABS Take 1 tablet by mouth daily.    Historical Provider, MD  omeprazole (PRILOSEC) 20 MG capsule Take 20 mg by mouth daily.    Historical Provider, MD  OVER THE COUNTER MEDICATION Take 1 capsule by mouth 2 (two) times daily. Eye promise restore    Historical Provider, MD  simvastatin (ZOCOR) 10 MG tablet Take 10 mg by mouth at bedtime.    Historical Provider, MD   BP 185/64 mmHg  Pulse 82  Temp(Src) 98.4 F (36.9 C) (Oral)  Resp 18  SpO2 96% Physical Exam  Constitutional: She appears well-developed and well-nourished.  HENT:  Head: Normocephalic and atraumatic.  Pulmonary/Chest: Effort normal.  Musculoskeletal: She exhibits tenderness. She exhibits no edema.  Pt has tenderness distal radius and mid carpal region dorsally left hand, no step off, full rom of wrist and hand however painful, nv intact.  Neurological: She is alert.  Skin: Skin is warm. No erythema.  Nursing note and vitals reviewed.   ED Course  Procedures (including critical care time) Labs Review Labs Reviewed - No data to display  Imaging Review Dg Wrist Complete Left  04/02/2015   CLINICAL DATA:  Left wrist and hand pain over the past 3 hours after a fall onto outstretched hand.  EXAM: LEFT WRIST - COMPLETE 3+ VIEW  COMPARISON:  None.  FINDINGS: Degenerative findings with loss of articular space laterally in the wrist. These are most striking at the first carpometacarpal articulation. No well-defined fracture.  IMPRESSION: 1. Degenerative findings  laterally in the wrist. No definite fracture identified.   Electronically Signed   By: Gaylyn Rong M.D.   On: 04/02/2015 17:33   Dg Hand Complete Left  04/02/2015   CLINICAL DATA:  Fall with hand injury. Swelling along dorsum of wrist. Delayed pain.  EXAM: LEFT HAND - COMPLETE 3+ VIEW  COMPARISON:  Wrist films, dictated separately.  FINDINGS: Degenerative changes at the base of the thumb and radial aspect of the carpal bones. Osteopenia. Scattered degenerative changes, including at multiple proximal and distal interphalangeal joints. Overlap of fingers on the lateral view.  IMPRESSION: No acute osseous abnormality.   Electronically Signed   By: Jeronimo Greaves M.D.   On: 04/02/2015 17:34     EKG Interpretation None      MDM   Final diagnoses:  Wrist pain, acute, left  Fall, initial encounter   Xrays reviewed, no fx, arthritis, supportive care discussed. Mechanical fall.  Results and differential diagnosis were discussed with the patient/parent/guardian. Close follow up outpatient was discussed, comfortable with the plan.   Medications  HYDROcodone-acetaminophen (NORCO/VICODIN) 5-325 MG per tablet 1 tablet (1 tablet Oral Given 04/02/15 1758)    Filed Vitals:   04/02/15 1656  BP: 185/64  Pulse: 82  Temp: 98.4 F (36.9 C)  TempSrc: Oral  Resp: 18  SpO2: 96%    Final diagnoses:  Wrist pain, acute, left  Fall, initial encounter       [name], MD 04/03/15 1431

## 2016-08-11 IMAGING — CR DG HAND COMPLETE 3+V*L*
3 series · 3 of 3 positions shown · non-contrast
Comparison: Wrist films, dictated separately.

CLINICAL DATA: Fall with hand injury. Swelling along dorsum of
wrist. Delayed pain.

EXAM:
LEFT HAND - COMPLETE 3+ VIEW

[x hand pa left]
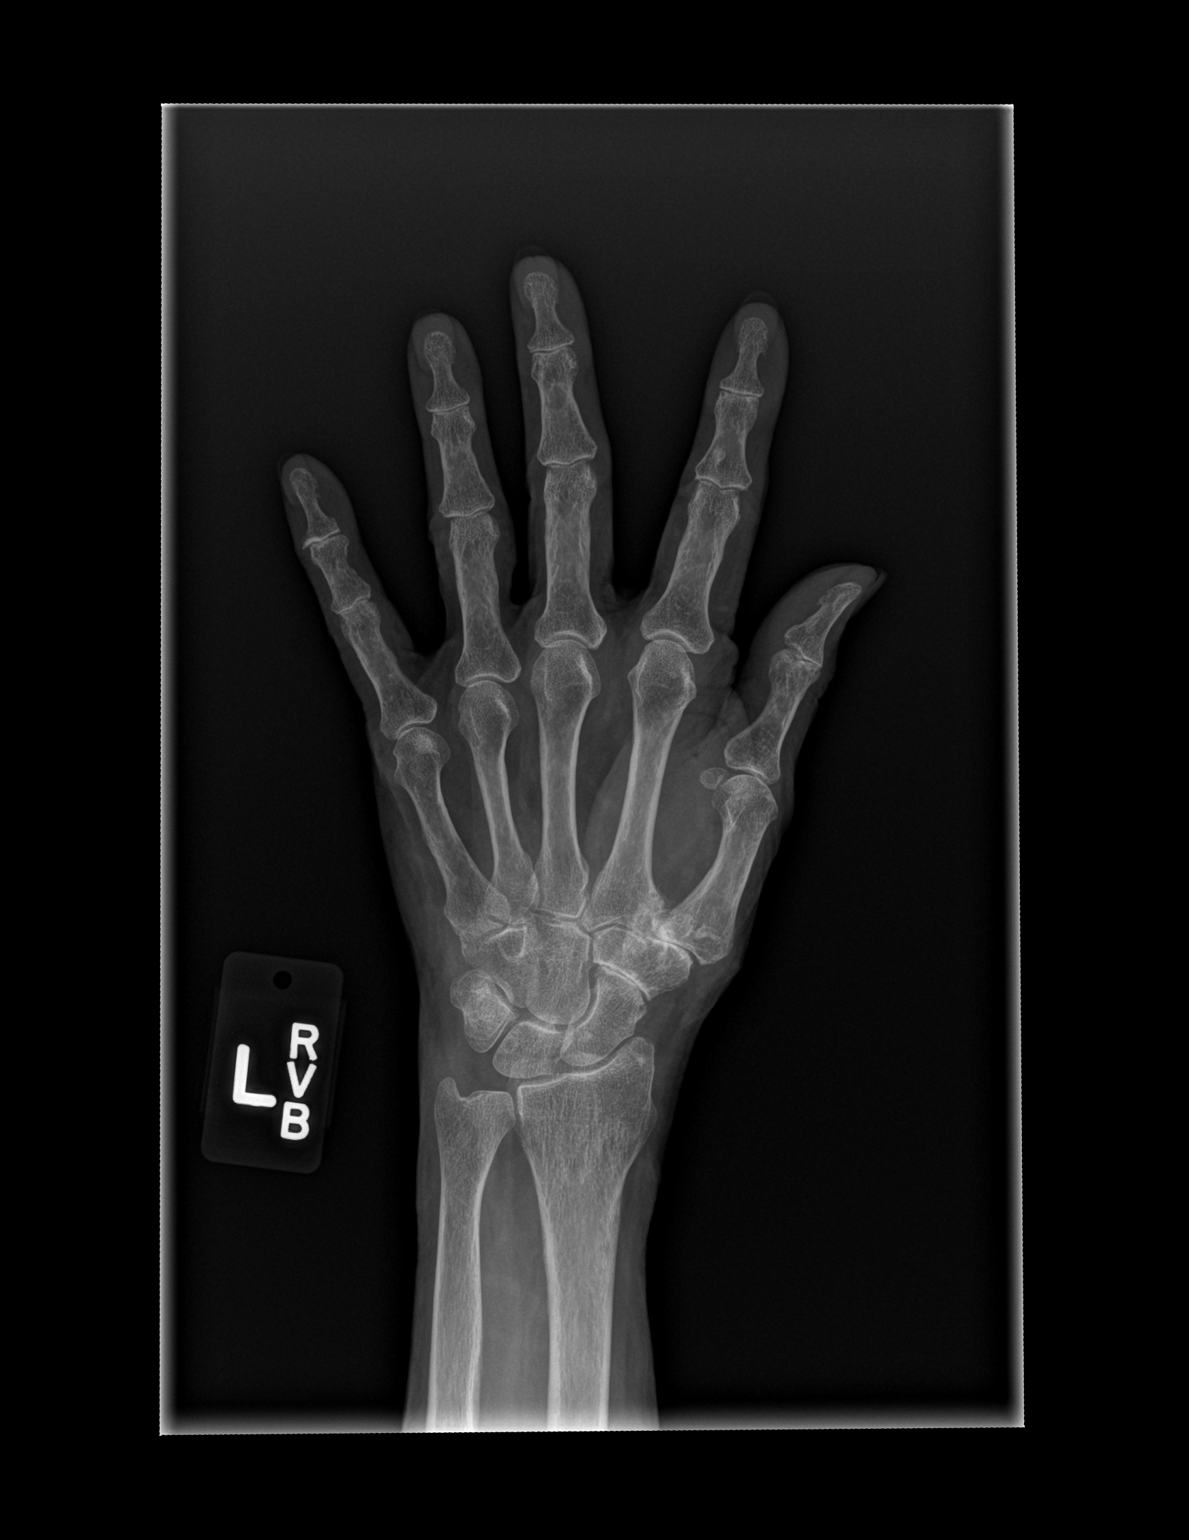

[x hand obl left]
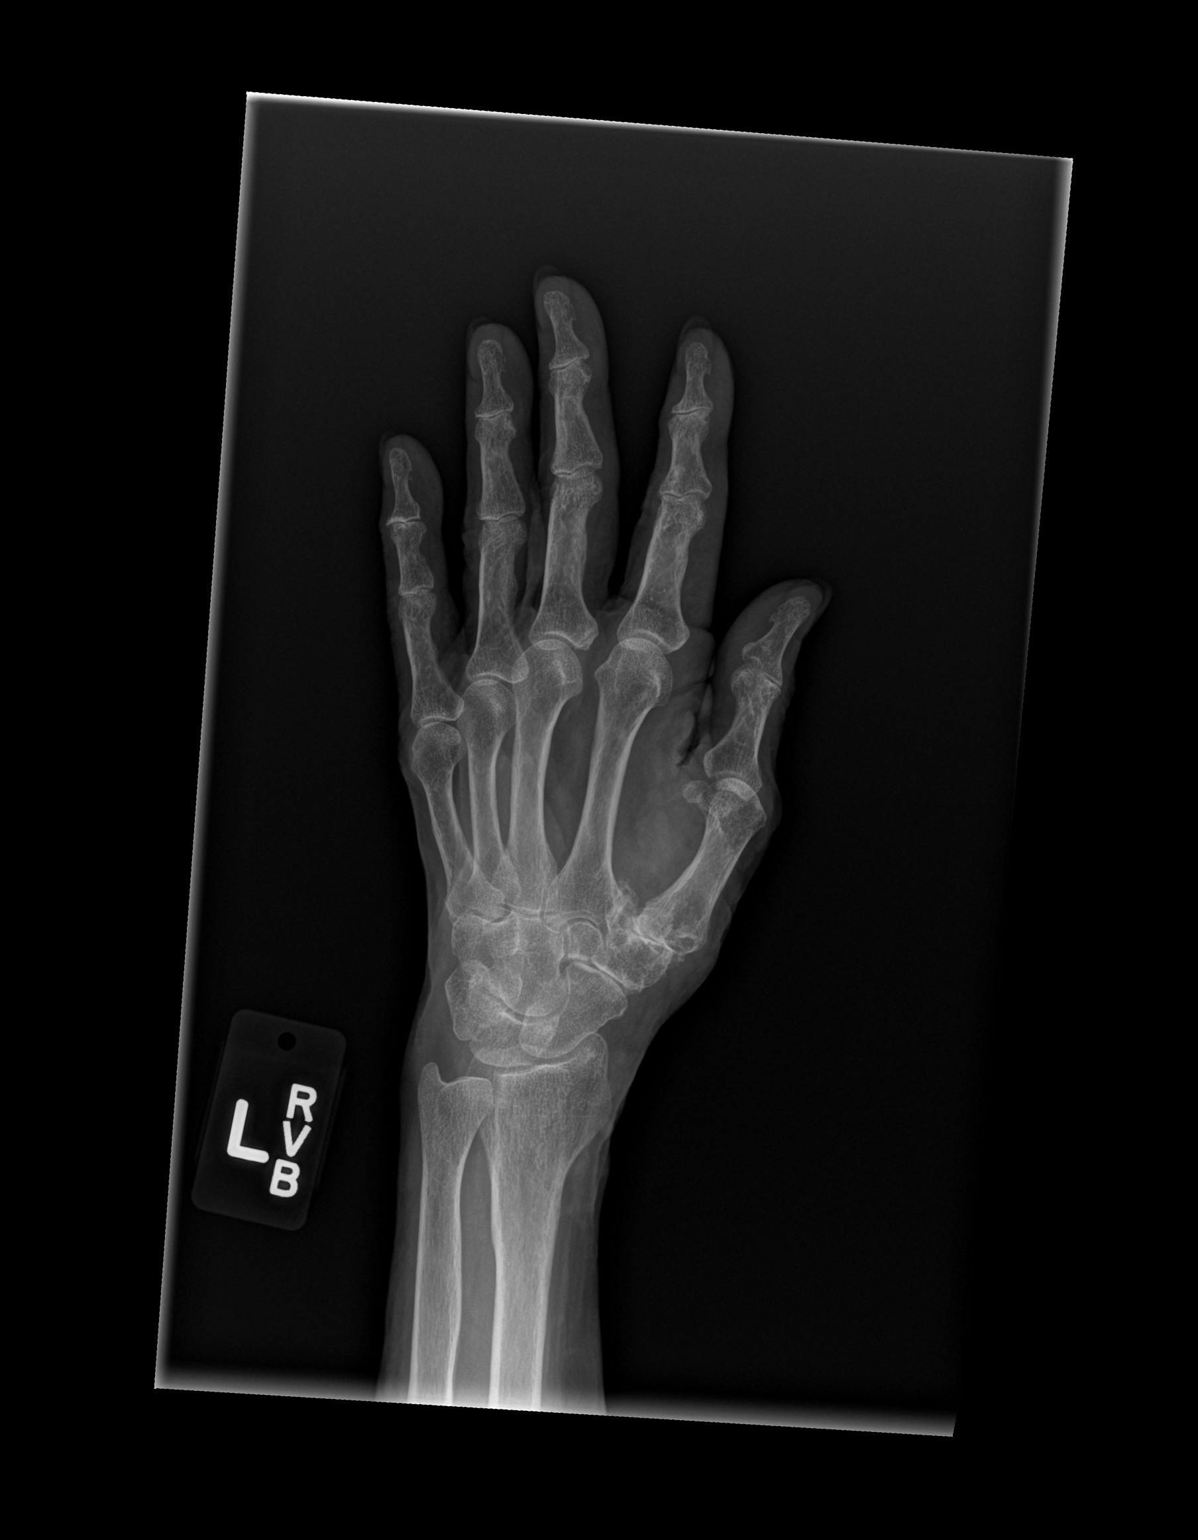

[x hand lat left]
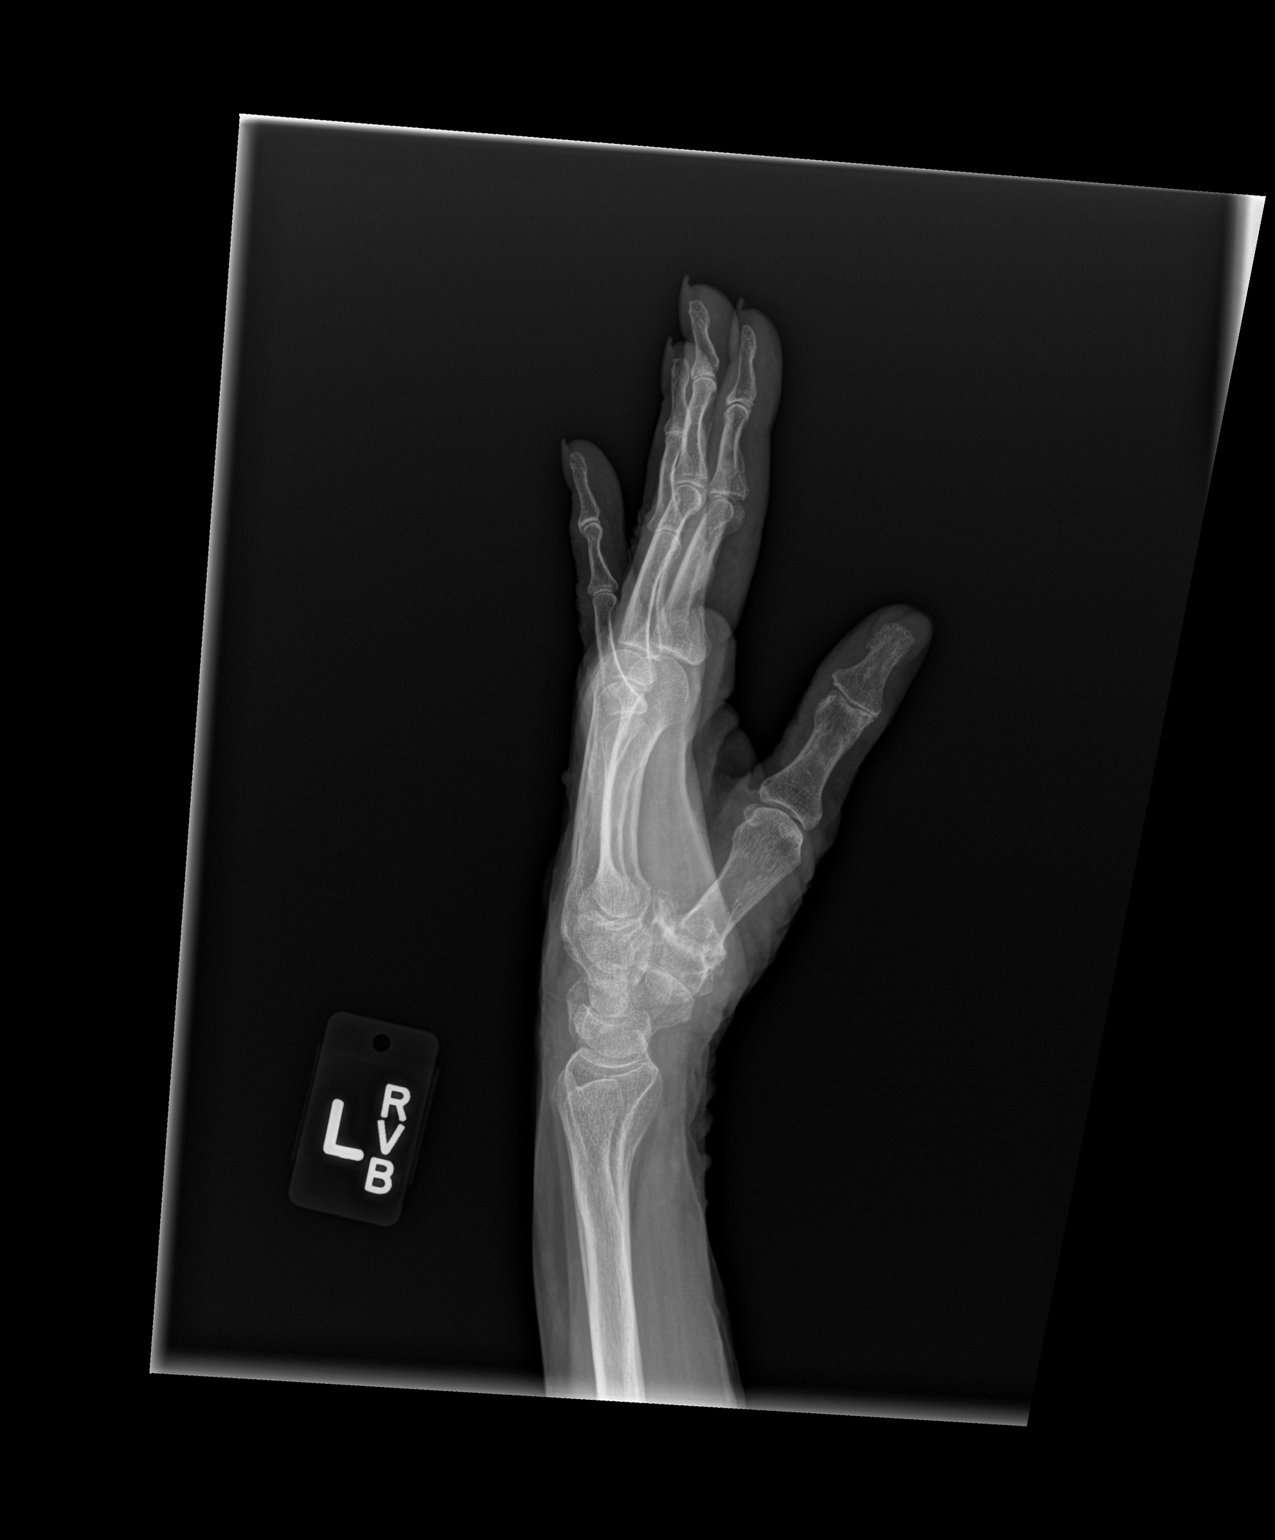

[3 of 3 positions shown; findings below may reference images not displayed]

FINDINGS: Degenerative changes at the base of the thumb and radial aspect of
the carpal bones. Osteopenia. Scattered degenerative changes,
including at multiple proximal and distal interphalangeal joints.
Overlap of fingers on the lateral view.
IMPRESSION: No acute osseous abnormality.
